# Patient Record
Sex: Male | Born: 1985 | Race: Black or African American | Hispanic: No | Marital: Single | Smoking: Never smoker
Health system: Southern US, Community
[De-identification: ages and names within clinical notes are randomized; demographics above are authoritative.]

## PROBLEM LIST (undated history)

## (undated) DIAGNOSIS — E119 Type 2 diabetes mellitus without complications: Secondary | ICD-10-CM

## (undated) DIAGNOSIS — I10 Essential (primary) hypertension: Secondary | ICD-10-CM

---
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
## 2005-08-01 ENCOUNTER — Ambulatory Visit: Payer: Self-pay | Admitting: Internal Medicine

## 2009-09-17 ENCOUNTER — Ambulatory Visit: Payer: Self-pay | Admitting: Family Medicine

## 2009-09-25 ENCOUNTER — Ambulatory Visit: Payer: Self-pay | Admitting: Family Medicine

## 2009-09-28 ENCOUNTER — Encounter: Admission: RE | Admit: 2009-09-28 | Discharge: 2009-09-28 | Payer: Self-pay | Admitting: Family Medicine

## 2010-03-26 ENCOUNTER — Ambulatory Visit: Payer: Self-pay | Admitting: Family Medicine

## 2010-05-02 ENCOUNTER — Ambulatory Visit (HOSPITAL_BASED_OUTPATIENT_CLINIC_OR_DEPARTMENT_OTHER): Admission: RE | Admit: 2010-05-02 | Discharge: 2010-05-02 | Payer: Self-pay | Admitting: Family Medicine

## 2010-05-03 ENCOUNTER — Ambulatory Visit: Payer: Self-pay | Admitting: Family Medicine

## 2010-05-04 ENCOUNTER — Ambulatory Visit: Payer: Self-pay | Admitting: Family Medicine

## 2010-05-08 ENCOUNTER — Ambulatory Visit: Payer: Self-pay | Admitting: Internal Medicine

## 2010-08-13 ENCOUNTER — Ambulatory Visit: Payer: Self-pay | Admitting: Family Medicine

## 2010-11-22 ENCOUNTER — Ambulatory Visit: Admit: 2010-11-22 | Payer: Self-pay | Admitting: Family Medicine

## 2011-12-27 ENCOUNTER — Emergency Department (HOSPITAL_BASED_OUTPATIENT_CLINIC_OR_DEPARTMENT_OTHER)
Admission: EM | Admit: 2011-12-27 | Discharge: 2011-12-27 | Disposition: A | Discharge status: Home / Self Care | Payer: Worker's Compensation | Attending: Emergency Medicine | Admitting: Emergency Medicine

## 2011-12-27 ENCOUNTER — Encounter (HOSPITAL_BASED_OUTPATIENT_CLINIC_OR_DEPARTMENT_OTHER): Payer: Self-pay

## 2011-12-27 ENCOUNTER — Emergency Department (INDEPENDENT_AMBULATORY_CARE_PROVIDER_SITE_OTHER): Payer: Worker's Compensation

## 2011-12-27 DIAGNOSIS — S93401A Sprain of unspecified ligament of right ankle, initial encounter: Secondary | ICD-10-CM

## 2011-12-27 DIAGNOSIS — M25579 Pain in unspecified ankle and joints of unspecified foot: Secondary | ICD-10-CM

## 2011-12-27 DIAGNOSIS — R609 Edema, unspecified: Secondary | ICD-10-CM | POA: Insufficient documentation

## 2011-12-27 DIAGNOSIS — S93409A Sprain of unspecified ligament of unspecified ankle, initial encounter: Secondary | ICD-10-CM | POA: Insufficient documentation

## 2011-12-27 DIAGNOSIS — Y9289 Other specified places as the place of occurrence of the external cause: Secondary | ICD-10-CM | POA: Insufficient documentation

## 2011-12-27 DIAGNOSIS — M899 Disorder of bone, unspecified: Secondary | ICD-10-CM

## 2011-12-27 DIAGNOSIS — X500XXA Overexertion from strenuous movement or load, initial encounter: Secondary | ICD-10-CM | POA: Insufficient documentation

## 2011-12-27 DIAGNOSIS — W19XXXA Unspecified fall, initial encounter: Secondary | ICD-10-CM

## 2011-12-27 MED ORDER — IBUPROFEN 800 MG PO TABS: 800.0000 mg | ORAL_TABLET | Freq: Three times a day (TID) | ORAL | Status: AC

## 2011-12-27 NOTE — ED Notes (Signed)
Patient transported to X-ray 

## 2011-12-27 NOTE — ED Notes (Signed)
Pt returned from radiology.

## 2011-12-27 NOTE — ED Notes (Signed)
Injury to right ankle that occurred while at work.  Pain increases with weight bearing.

## 2011-12-27 NOTE — ED Provider Notes (Signed)
History     CSN: [id number]  Arrival date & time 12/27/11  1045   First MD Initiated Contact with Patient 12/27/11 1251      Chief Complaint  Patient presents with  . Ankle Pain    (Consider location/radiation/quality/duration/timing/severity/associated sxs/prior treatment) Patient is a 26 y.o. male presenting with ankle pain. The history is provided by the patient. No language interpreter was used.  Ankle Pain  The incident occurred 1 to 2 hours ago. The incident occurred at work. The pain is present in the right ankle. The quality of the pain is described as aching. The pain is at a severity of 6/10. The pain is moderate. The pain has been constant since onset. Pertinent negatives include no inability to bear weight. He reports no foreign bodies present. The symptoms are aggravated by bearing weight. He has tried nothing for the symptoms. The treatment provided no relief.  Pt reports he turned his ankle at work.  Pt complains of swelling and pain    History reviewed. No pertinent past medical history.  History reviewed. No pertinent past surgical history.  No family history on file.  History  Substance Use Topics  . Smoking status: Never Smoker   . Smokeless tobacco: Never Used  . Alcohol Use: Yes     occasional      Review of Systems  Musculoskeletal: Positive for myalgias and joint swelling.  All other systems reviewed and are negative.    Allergies  Review of patient's allergies indicates no known allergies.  Home Medications  No current outpatient prescriptions on file.  BP 168/98  Pulse 77  Temp(Src) 98.2 F (36.8 C) (Oral)  Resp 20  Ht 6' (1.829 m)  Wt 320 lb (145.151 kg)  BMI 43.40 kg/m2  SpO2 100%  Physical Exam  Nursing note and vitals reviewed. Constitutional: He is oriented to person, place, and time. He appears well-developed and well-nourished.  HENT:  Head: Normocephalic.  Eyes: Pupils are equal, round, and reactive to light.    Musculoskeletal: He exhibits edema and tenderness.       Tender right ankle,  Decreased range of motion right ankle,  nv and ns intact  No instability  Neurological: He is alert and oriented to person, place, and time.  Skin: Skin is warm.  Psychiatric: He has a normal mood and affect.    ED Course  Procedures (including critical care time)  Labs Reviewed - No data to display Dg Ankle Complete Right  12/27/2011  *RADIOLOGY REPORT*  Clinical Data: 26 year old male status post fall, lateral ankle pain.  RIGHT ANKLE - COMPLETE 3+ VIEW  Comparison: Report of the tib-fib study from 12/10/1998  Findings: Healed distal right tibia shaft fracture with callus. Eccentric mixed lucent and sclerotic lesion of the distal right tibia metadiaphysis.  Mild cortical buckling but no periosteal reaction. This lesion was described in 2000.  There may be a small joint effusion.  Mortise joint alignment is preserved.  Talar dome is intact.  Calcaneus is intact.  No acute fracture or dislocation is identified.  Mild talar beaking is noted.  IMPRESSION: 1. No acute fracture or dislocation identified about the right ankle.  Ankle joint effusion suspected. 2.  Chronic benign distal tibial metadiaphysis lesion, reported in 2000. 3.  Remote distal third tibial shaft fracture.  Original Report Authenticated By: Harley Hallmark, M.D.     No diagnosis found.    MDM  Ibuprofen 800   aso crutches,  Follow up at occ med  for recheck in 3-4 days        Langston Masker, [place] 12/27/11 1341

## 2011-12-27 NOTE — ED Notes (Signed)
Karen Sofia, PA-C at bedside 

## 2011-12-27 NOTE — Discharge Instructions (Signed)
Ankle Sprain °An ankle sprain is an injury to the ligaments that hold the ankle joint together.  °CAUSES °The injury is usually caused by a fall or by twisting the ankle. It is important to tell your caregiver how the injury occurred and whether or not you were able to walk immediately after the injury.  °SYMPTOMS  °Pain is the primary symptom. It may be present at rest or only when you are trying to stand or walk. The ankle will likely be swollen. Bruising may develop immediately or after 1 or 2 days. It may be difficult or impossible to stand or walk. This depends on the severity of the sprain. °DIAGNOSIS  °Your caregiver can determine if a sprain has occurred based on the accident details and on examination of your ankle. Examination will include pressing and squeezing areas of the foot and ankle. Your caregiver will try to move the ankle in certain ways. X-rays may be used to be sure a bone was not broken, or that the ligament did not pull off of a bone (avulsion). There are standard guidelines that can reliably determine if an X-ray is needed. °TREATMENT  °Rest, ice, elevation, and compression are the basic modes of treatment. Certain types of braces can help stabilize the ankle and allow early return to walking. Your caregiver can make a recommendation for this. Medication may be recommended for pain. You may be referred to an orthopedist or a physical therapist for certain types of severe sprains. °HOME CARE INSTRUCTIONS  °· Apply ice to the sore area for 15 to 20 minutes, 3 to 4 times per day. Do this while you are awake for the first 2 days, or as directed. This can be stopped when the swelling goes away. Put the ice in a plastic bag and place a towel between the bag of ice and your skin.  °· Keep your leg elevated when possible to lessen swelling.  °· If your caregiver recommends crutches, use them as instructed with a non-weight bearing cast for 1 week. Then, you may walk on your ankle as the pain allows,  or as instructed. Gradually, put weight on the affected ankle. Continue to use crutches or a cane until you can walk without causing pain.  °· If a plaster splint was applied, wear the splint until you are seen for a follow-up examination. Rest it on nothing harder than a pillow the first 24 hours. Do not put weight on it. Do not get it wet. You may take it off to take a shower or bath.  °· You may have been given an elastic bandage to use with the plaster splint, or you may have been given a elastic bandage to use alone. The elastic bandage is too tight if you have numbness, tingling, or if your foot becomes cold and blue. Adjust the bandage to make it comfortable.  °· If an air splint was applied, you may blow more air into it or take some out to make it more comfortable. You may take it off at night and to take a shower or bath. Wiggle your toes in the splint several times per day if you are able.  °· Only take over-the-counter or prescription medicines for pain, discomfort, or fever as directed by your caregiver.  °· Do not drive a vehicle until your caregiver specifically tells you it is safe to do so.  °SEEK MEDICAL CARE IF:  °· You have an increase in bruising, swelling, or pain.  °· Your   toes feel cold.  °· Pain relief is not achieved with medications.  °SEEK IMMEDIATE MEDICAL CARE IF: °Your toes are numb or blue or you have severe pain. °MAKE SURE YOU:  °· Understand these instructions.  °· Will watch your condition.  °· Will get help right away if you are not doing well or get worse.  °Document Released: 10/31/2005 Document Revised: 02/04/2011 Document Reviewed: 06/04/2008 °[employer]® Patient Information ©2012 [employer]. °

## 2011-12-27 NOTE — ED Provider Notes (Signed)
Medical screening examination/treatment/procedure(s) were performed by non-physician practitioner and as supervising physician I was immediately available for consultation/collaboration.   Dione Booze, MD 12/27/11 [phone]

## 2019-11-18 ENCOUNTER — Ambulatory Visit: Payer: 59 | Attending: Internal Medicine

## 2021-03-30 ENCOUNTER — Encounter: Payer: Self-pay | Admitting: Emergency Medicine

## 2021-03-30 ENCOUNTER — Ambulatory Visit
Admission: EM | Admit: 2021-03-30 | Discharge: 2021-03-30 | Disposition: A | Discharge status: Home / Self Care | Payer: 59

## 2021-03-30 ENCOUNTER — Ambulatory Visit (INDEPENDENT_AMBULATORY_CARE_PROVIDER_SITE_OTHER): Payer: 59

## 2021-03-30 ENCOUNTER — Other Ambulatory Visit: Payer: Self-pay

## 2021-03-30 DIAGNOSIS — R059 Cough, unspecified: Secondary | ICD-10-CM

## 2021-03-30 HISTORY — DX: Type 2 diabetes mellitus without complications: E11.9

## 2021-03-30 HISTORY — DX: Essential (primary) hypertension: I10

## 2021-03-30 MED ORDER — PROMETHAZINE-DM 6.25-15 MG/5ML PO SYRP: 5.0000 mL | ORAL_SOLUTION | Freq: Four times a day (QID) | ORAL | 0 refills | Status: DC | PRN

## 2021-03-30 MED ORDER — BENZONATATE 100 MG PO CAPS: 200.0000 mg | ORAL_CAPSULE | Freq: Three times a day (TID) | ORAL | 0 refills | Status: DC

## 2021-03-30 MED ORDER — DOXYCYCLINE HYCLATE 100 MG PO CAPS: 100.0000 mg | ORAL_CAPSULE | Freq: Two times a day (BID) | ORAL | 0 refills | Status: DC

## 2021-03-30 NOTE — Discharge Instructions (Addendum)
Take the doxycycline twice daily with food for 10 days.  Use the Tessalon Perles every 8 hours during the day as needed for cough.  Take them with a small sip of water.  You may experience a numbness to the base of your tongue or metallic taste in her mouth, this is normal.  Use the Promethazine DM cough syrup at bedtime.  This cough syrup will make you drowsy.  Return for reevaluation for any new or worsening symptoms.

## 2021-03-30 NOTE — ED Triage Notes (Signed)
Pt c/o cough, fatigue, nasal congestion and sore throat. Started about 3 and half months ago. He states he has muscle soreness in his neck and chest from the coughing. Sore throat started about a month ago.

## 2021-03-30 NOTE — ED Provider Notes (Signed)
[facility]    CSN: [id number] Arrival date & time: 03/30/21  1631      History   Chief Complaint Chief Complaint  Patient presents with  . Cough    HPI Kyle Hernandez is a 35 y.o. male.   HPI   35 year old male here for evaluation of cough, fatigue, nasal congestion, and sore throat.  Patient reports that he has been experiencing symptoms for the last 3 and half months.  He reports that his cough is productive for a bloody rusty sputum.  He denies fever, shortness breath or wheezing, or runny nose.  Past Medical History:  Diagnosis Date  . Diabetes mellitus without complication (HCC)   . Hypertension     There are no problems to display for this patient.   History reviewed. No pertinent surgical history.     Home Medications    Prior to Admission medications   Medication Sig Start Date End Date Taking? Authorizing Provider  amLODipine (NORVASC) 5 MG tablet Take 1 tablet by mouth daily. 01/07/21  Yes [provider]  benzonatate (TESSALON) 100 MG capsule Take 2 capsules (200 mg total) by mouth every 8 (eight) hours. 03/30/21  Yes Becky Augusta, NP  doxycycline (VIBRAMYCIN) 100 MG capsule Take 1 capsule (100 mg total) by mouth 2 (two) times daily. 03/30/21  Yes Becky Augusta, NP  metFORMIN (GLUCOPHAGE) 500 MG tablet Take by mouth. 12/11/20 06/09/21 Yes [provider]  promethazine-dextromethorphan (PROMETHAZINE-DM) 6.25-15 MG/5ML syrup Take 5 mLs by mouth 4 (four) times daily as needed. 03/30/21  Yes Becky Augusta, NP    Family History Family History  Problem Relation Age of Onset  . Stroke Father     Social History Social History   Tobacco Use  . Smoking status: Never Smoker  . Smokeless tobacco: Never Used  Vaping Use  . Vaping Use: Never used  Substance Use Topics  . Alcohol use: Yes    Comment: occasional  . Drug use: No     Allergies   Patient has no known allergies.   Review of Systems Review of Systems   Constitutional: Negative for activity change, appetite change and fever.  HENT: Positive for congestion, rhinorrhea and sore throat.   Respiratory: Positive for cough. Negative for shortness of breath and wheezing.   Musculoskeletal: Negative for arthralgias and myalgias.  Skin: Negative for rash.  Hematological: Negative.   Psychiatric/Behavioral: Negative.      Physical Exam Triage Vital Signs ED Triage Vitals  Enc Vitals Group     BP 03/30/21 1747 (!) 147/103     Pulse Rate 03/30/21 1747 (!) 111     Resp 03/30/21 1747 20     Temp 03/30/21 1747 99.5 F (37.5 C)     Temp Source 03/30/21 1747 Oral     SpO2 03/30/21 1747 98 %     Weight 03/30/21 1745 (!) 320 lb 1.7 oz (145.2 kg)     Height 03/30/21 1745 6' (1.829 m)     Head Circumference --      Peak Flow --      Pain Score 03/30/21 1745 6     Pain Loc --      Pain Edu? --      Excl. in GC? --    No data found.  Updated Vital Signs BP (!) 147/103 (BP Location: Left Arm)   Pulse (!) 111   Temp 99.5 F (37.5 C) (Oral)   Resp 20   Ht 6' (1.829 m)  Wt (!) 320 lb 1.7 oz (145.2 kg)   SpO2 98%   BMI 43.41 kg/m   Visual Acuity Right Eye Distance:   Left Eye Distance:   Bilateral Distance:    Right Eye Near:   Left Eye Near:    Bilateral Near:     Physical Exam Vitals and nursing note reviewed.  Constitutional:      General: He is not in acute distress.    Appearance: Normal appearance. He is not ill-appearing.  HENT:     Head: Normocephalic and atraumatic.     Right Ear: Tympanic membrane, ear canal and external ear normal.     Left Ear: Tympanic membrane, ear canal and external ear normal.     Nose: Congestion and rhinorrhea present.     Mouth/Throat:     Mouth: Mucous membranes are moist.     Pharynx: Oropharynx is clear. No posterior oropharyngeal erythema.  Cardiovascular:     Rate and Rhythm: Normal rate and regular rhythm.     Pulses: Normal pulses.     Heart sounds: Normal heart sounds. No murmur  heard. No gallop.   Pulmonary:     Effort: Pulmonary effort is normal.     Breath sounds: No wheezing, rhonchi or rales.  Musculoskeletal:     Cervical back: Normal range of motion and neck supple.  Lymphadenopathy:     Cervical: No cervical adenopathy.  Skin:    General: Skin is warm and dry.     Capillary Refill: Capillary refill takes less than 2 seconds.     Findings: No erythema or rash.  Neurological:     General: No focal deficit present.     Mental Status: He is alert and oriented to person, place, and time.  Psychiatric:        Mood and Affect: Mood normal.        Behavior: Behavior normal.        Thought Content: Thought content normal.        Judgment: Judgment normal.      UC Treatments / Results  Labs (all labs ordered are listed, but only abnormal results are displayed) Labs Reviewed - No data to display  EKG   Radiology DG Chest 2 View  Result Date: 03/30/2021 CLINICAL DATA:  Cough for 3 months EXAM: CHEST - 2 VIEW COMPARISON:  None. FINDINGS: The heart size and mediastinal contours are within normal limits. Both lungs are clear. The visualized skeletal structures are unremarkable. IMPRESSION: No active cardiopulmonary disease. Electronically Signed   By: Jasmine Pang M.D.   On: 03/30/2021 18:36    Procedures Procedures (including critical care time)  Medications Ordered in UC Medications - No data to display  Initial Impression / Assessment and Plan / UC Course  I have reviewed the triage vital signs and the nursing notes.  Pertinent labs & imaging results that were available during my care of the patient were reviewed by me and considered in my medical decision making (see chart for details).   Patient is a very pleasant 35 year old male here for evaluation of respiratory complaints have been going for last 3/2 months and include a cough that is productive for a bloody sputum, fatigue, nasal congestion, and a sore throat.  Patient treated for sore  throat too his extensive coughing.  Patient denies fever, shortness of breath or wheezing, or runny nose.  Physical exam reveals pearly gray tympanic membranes bilaterally with normal light reflex and clear external auditory canals.  Nasal mucosa  has mild erythema and edema with clear nasal discharge.  Oropharyngeal exam reveals the presence of bloody secretions without overt edema or erythema of the tissues.  No cervical lymphadenopathy appreciated exam.  Cardiopulmonary exam reveals decreased lung sounds diffusely.  Will obtain chest x-ray to look for acute intrathoracic process.  Bloody sputum is concerning for tuberculosis as well as possible pneumonia.  Patient denies any recent foreign travel.  Radiology interpretation of chest x-ray is that is negative for acute intrathoracic process.  Due to patient's protracted length of symptoms, combined with his bloody sputum, we will treat him with antibiotics out of an abundance of precaution.  We will also give patient Tessalon Perles and Promethazine DM cough syrup for cough control.   Final Clinical Impressions(s) / UC Diagnoses   Final diagnoses:  Cough     Discharge Instructions     Take the doxycycline twice daily with food for 10 days.  Use the Tessalon Perles every 8 hours during the day as needed for cough.  Take them with a small sip of water.  You may experience a numbness to the base of your tongue or metallic taste in her mouth, this is normal.  Use the Promethazine DM cough syrup at bedtime.  This cough syrup will make you drowsy.  Return for reevaluation for any new or worsening symptoms.    ED Prescriptions    Medication Sig Dispense Auth. Provider   doxycycline (VIBRAMYCIN) 100 MG capsule Take 1 capsule (100 mg total) by mouth 2 (two) times daily. 20 capsule Becky Augusta, NP   benzonatate (TESSALON) 100 MG capsule Take 2 capsules (200 mg total) by mouth every 8 (eight) hours. 21 capsule Becky Augusta, NP    promethazine-dextromethorphan (PROMETHAZINE-DM) 6.25-15 MG/5ML syrup Take 5 mLs by mouth 4 (four) times daily as needed. 118 mL Becky Augusta, NP     PDMP not reviewed this encounter.   Becky Augusta, NP 03/30/21 [phone]

## 2022-07-29 IMAGING — CR DG CHEST 2V
2 series · 2 of 2 positions shown · non-contrast
Comparison: None.

CLINICAL DATA: Cough for 3 months

EXAM:
CHEST - 2 VIEW

[chest pa]
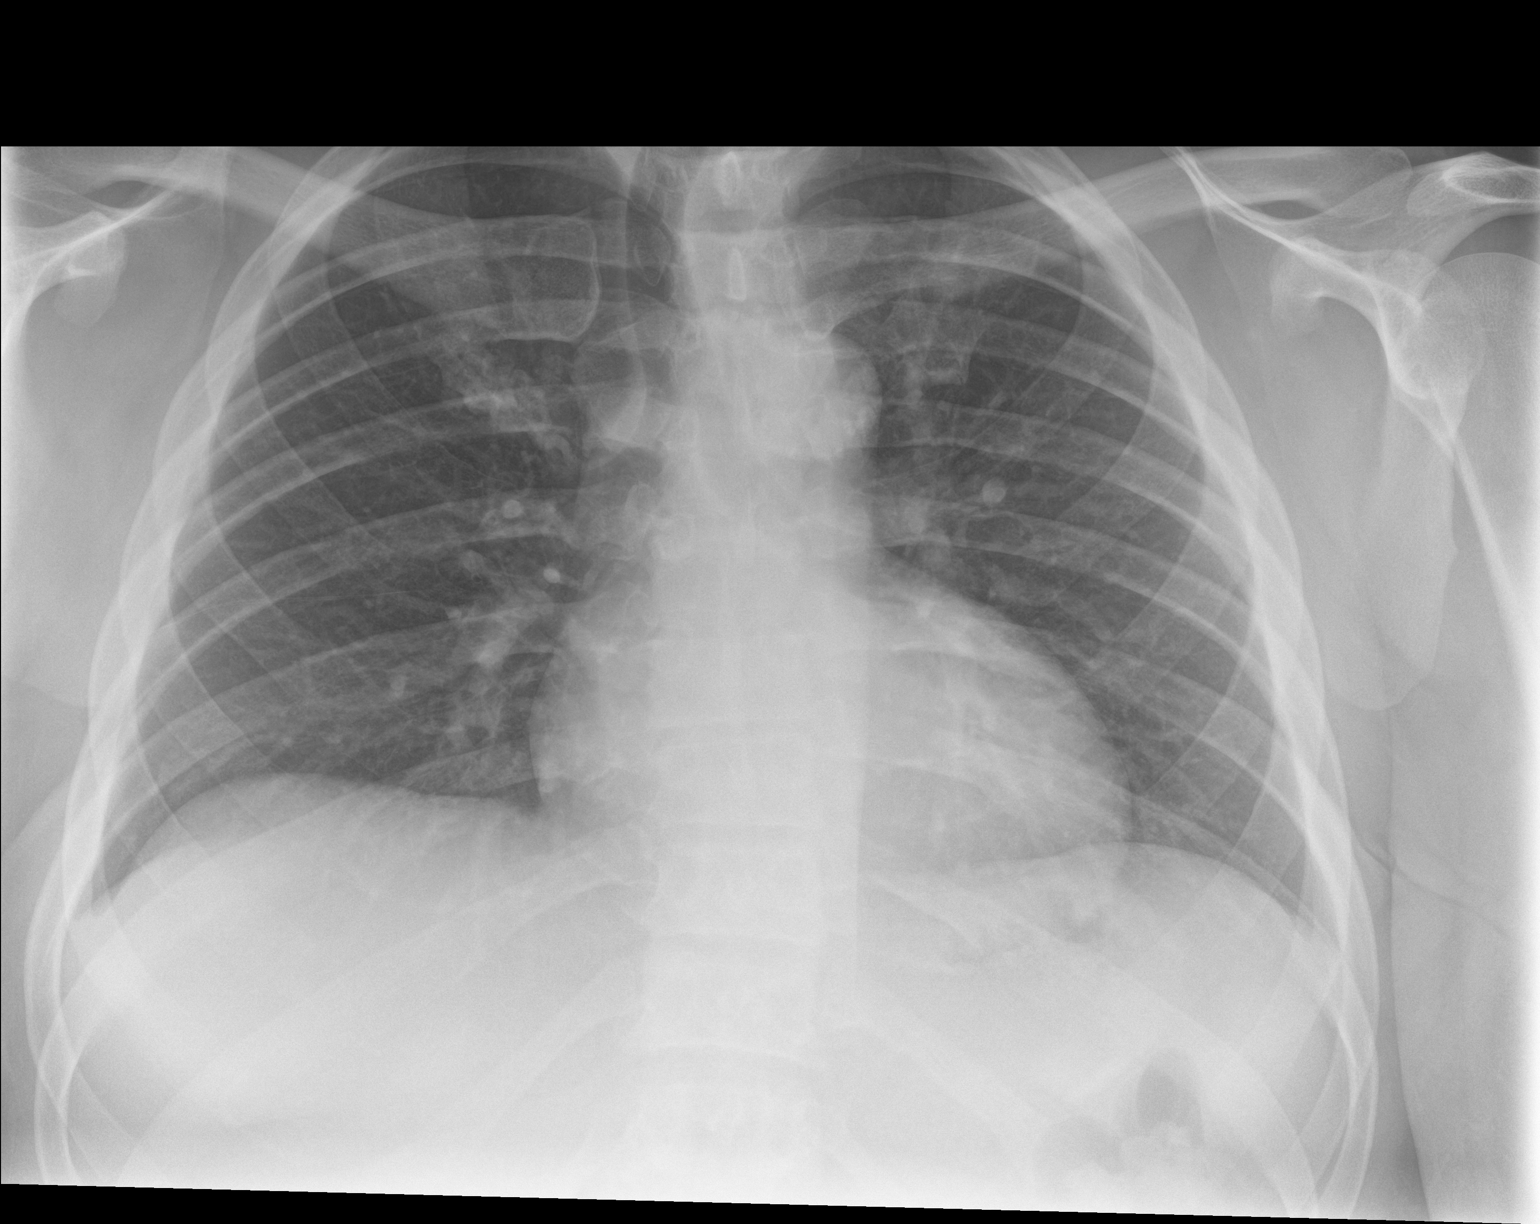

[chest lat]
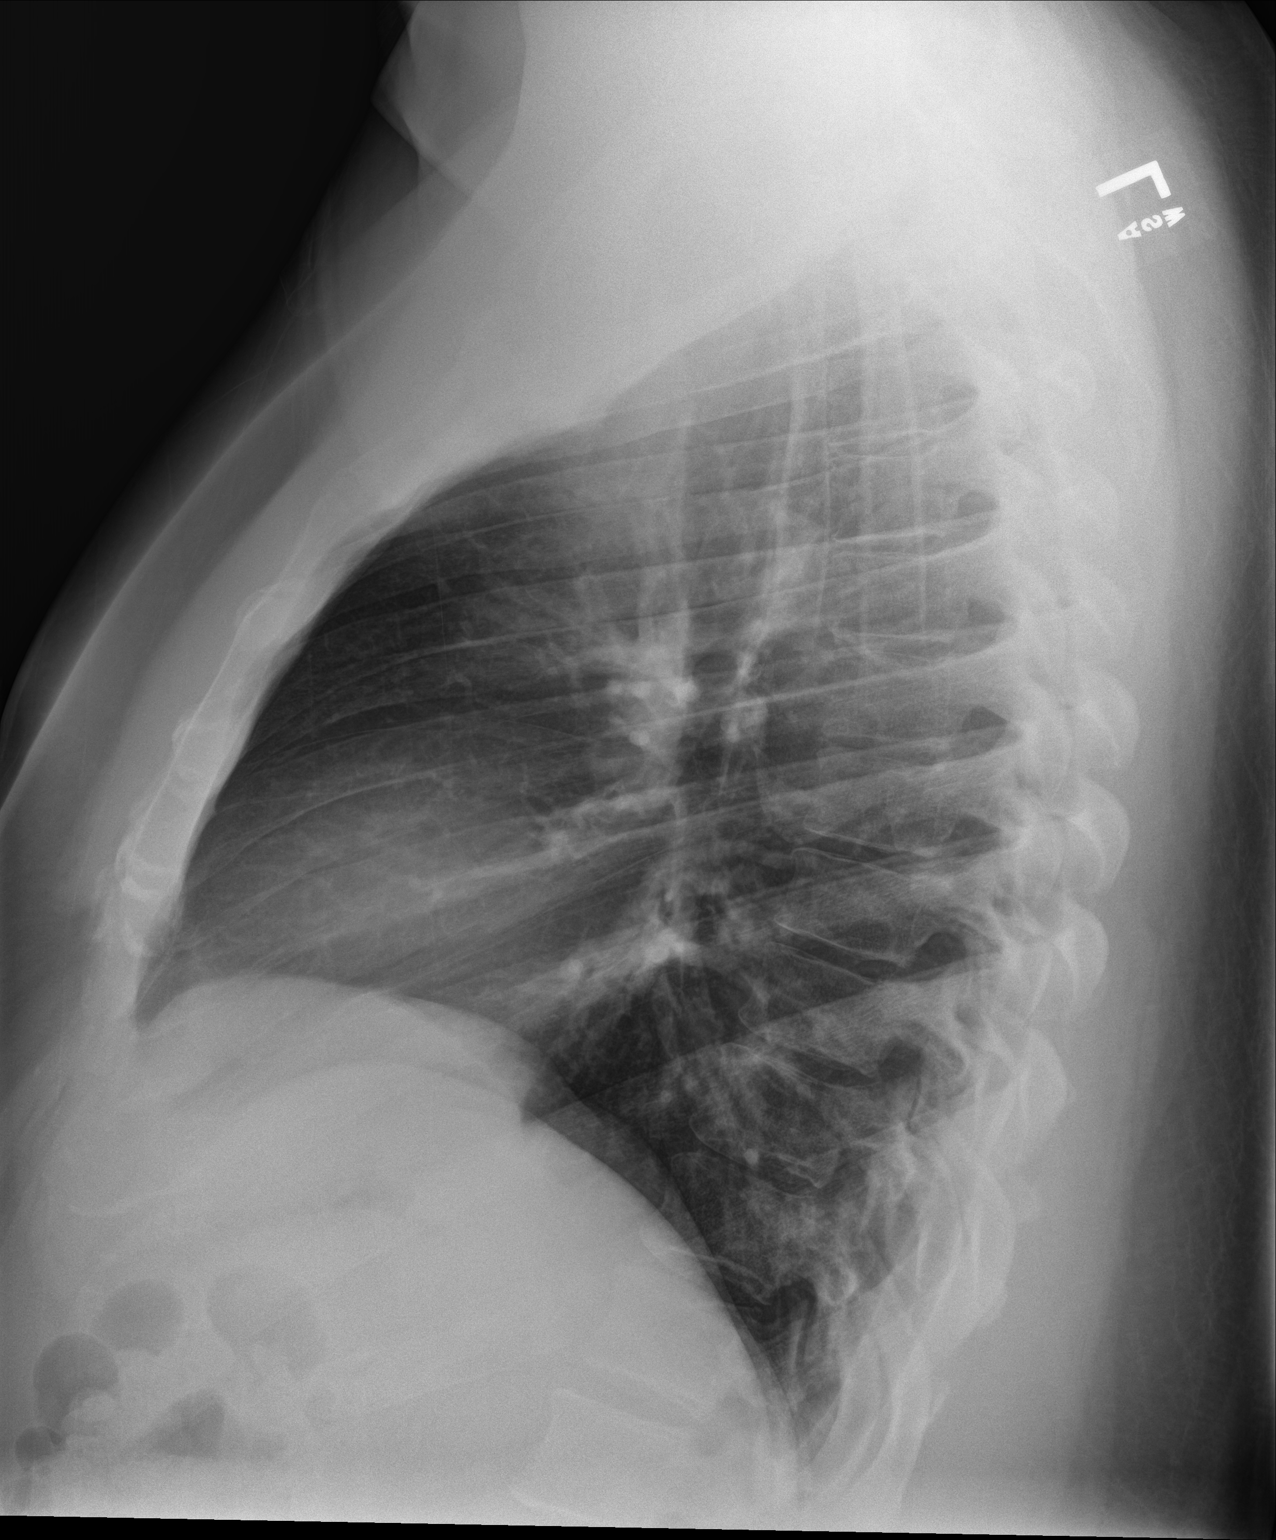

[2 of 2 positions shown; findings below may reference images not displayed]

FINDINGS: The heart size and mediastinal contours are within normal limits.
Both lungs are clear. The visualized skeletal structures are
unremarkable.
IMPRESSION: No active cardiopulmonary disease.

## 2022-11-25 ENCOUNTER — Ambulatory Visit
Admission: EM | Admit: 2022-11-25 | Discharge: 2022-11-25 | Disposition: A | Discharge status: Home / Self Care | Payer: 59 | Attending: Emergency Medicine | Admitting: Emergency Medicine

## 2022-11-25 ENCOUNTER — Encounter: Payer: Self-pay | Admitting: Emergency Medicine

## 2022-11-25 DIAGNOSIS — J0140 Acute pansinusitis, unspecified: Secondary | ICD-10-CM

## 2022-11-25 MED ORDER — AMOXICILLIN-POT CLAVULANATE 875-125 MG PO TABS: 1.0000 | ORAL_TABLET | Freq: Two times a day (BID) | ORAL | 0 refills | Status: AC

## 2022-11-25 MED ORDER — IPRATROPIUM BROMIDE 0.06 % NA SOLN: 2.0000 | Freq: Four times a day (QID) | NASAL | 12 refills | Status: AC

## 2022-11-25 NOTE — ED Triage Notes (Signed)
Patient c/o sinus congestion and nasal pressure that started 2 weeks ago.  Patient denies fevers.

## 2022-11-25 NOTE — Discharge Instructions (Signed)
The Augmentin twice daily with food for 10 days for treatment of your sinusitis.  Perform sinus irrigation 2-3 times a day with a [employer] sinus rinse kit and distilled water.  Do not use tap water.  Use the Atrovent nasal spray, 2 squirts in each nostril every 6 hours, as needed for nasal congestion.  You can use plain over-the-counter Mucinex every 6 hours to break up the stickiness of the mucus so your body can clear it.  Increase your oral fluid intake to thin out your mucus so that is also able for your body to clear more easily.  Take an over-the-counter probiotic, such as Culturelle-align-activia, 1 hour after each dose of antibiotic to prevent diarrhea.  If you develop any new or worsening symptoms return for reevaluation or see your primary care provider.

## 2022-11-25 NOTE — ED Provider Notes (Signed)
[facility]    CSN: [id number] Arrival date & time: 11/25/22  1315      History   Chief Complaint Chief Complaint  Patient presents with   Sinus Problem    HPI Kyle Hernandez is a 37 y.o. male.   HPI  37 year old male here for evaluation of sinus pressure.  The patient reports that he has been experiencing frontal and maxillary sinus pressure for the last 2 weeks.  He has been getting thick green-yellow discharge from his nose but denies any fever or ear pain.  He has had a mild intermittent cough and denies any shortness of breath or wheezing.  He has been doing sinus irrigation which has helped relieve some of his mucus burden.  Past Medical History:  Diagnosis Date   Diabetes mellitus without complication ([place])    Hypertension     There are no problems to display for this patient.   History reviewed. No pertinent surgical history.     Home Medications    Prior to Admission medications   Medication Sig Start Date End Date Taking? Authorizing Provider  amLODipine (NORVASC) 5 MG tablet Take 1 tablet by mouth daily. 01/07/21  Yes [provider]  amoxicillin-clavulanate (AUGMENTIN) 875-125 MG tablet Take 1 tablet by mouth every 12 (twelve) hours for 10 days. 11/25/22 12/05/22 Yes Margarette Canada, NP  ipratropium (ATROVENT) 0.06 % nasal spray Place 2 sprays into both nostrils 4 (four) times daily. 11/25/22  Yes Margarette Canada, NP  metFORMIN (GLUCOPHAGE) 500 MG tablet Take by mouth. 12/11/20 11/25/22 Yes [provider]    Family History Family History  Problem Relation Age of Onset   Stroke Father     Social History Social History   Tobacco Use   Smoking status: Never   Smokeless tobacco: Never  Vaping Use   Vaping Use: Never used  Substance Use Topics   Alcohol use: Yes    Comment: occasional   Drug use: No     Allergies   Patient has no known allergies.   Review of Systems Review of Systems  Constitutional:  Negative for  fever.  HENT:  Positive for congestion, rhinorrhea, sinus pressure and sinus pain. Negative for ear pain.   Respiratory:  Positive for cough. Negative for shortness of breath and wheezing.      Physical Exam Triage Vital Signs ED Triage Vitals  Enc Vitals Group     BP 11/25/22 1427 (!) 172/104     Pulse Rate 11/25/22 1427 100     Resp 11/25/22 1427 15     Temp 11/25/22 1427 98.6 F (37 C)     Temp Source 11/25/22 1427 Oral     SpO2 11/25/22 1427 95 %     Weight 11/25/22 1424 300 lb (136.1 kg)     Height 11/25/22 1424 6' (1.829 m)     Head Circumference --      Peak Flow --      Pain Score 11/25/22 1424 0     Pain Loc --      Pain Edu? --      Excl. in [place]? --    No data found.  Updated Vital Signs BP (!) 172/104 (BP Location: Right Arm) Comment: Patient has not been taking his BP medicine  Pulse 100   Temp 98.6 F (37 C) (Oral)   Resp 15   Ht 6' (1.829 m)   Wt 300 lb (136.1 kg)   SpO2 95%   BMI 40.69 kg/m  Visual Acuity Right Eye Distance:   Left Eye Distance:   Bilateral Distance:    Right Eye Near:   Left Eye Near:    Bilateral Near:     Physical Exam Vitals and nursing note reviewed.  Constitutional:      Appearance: Normal appearance. He is not ill-appearing.  HENT:     Head: Normocephalic and atraumatic.     Right Ear: Tympanic membrane, ear canal and external ear normal. There is no impacted cerumen.     Left Ear: Tympanic membrane, ear canal and external ear normal. There is no impacted cerumen.     Nose: Congestion and rhinorrhea present.     Comments: Patient is marked erythema and edema bilateral nasal passages.  There is purulent discharge in both nares.  He has tenderness to compression of bilateral frontal and maxillary sinuses on exam.    Mouth/Throat:     Mouth: Mucous membranes are moist.     Pharynx: Oropharynx is clear. No posterior oropharyngeal erythema.  Cardiovascular:     Rate and Rhythm: Normal rate and regular rhythm.      Pulses: Normal pulses.     Heart sounds: Normal heart sounds. No murmur heard.    No friction rub. No gallop.  Pulmonary:     Effort: Pulmonary effort is normal.     Breath sounds: Normal breath sounds. No wheezing, rhonchi or rales.  Musculoskeletal:     Cervical back: Normal range of motion and neck supple.  Lymphadenopathy:     Cervical: No cervical adenopathy.  Skin:    General: Skin is warm and dry.     Capillary Refill: Capillary refill takes less than 2 seconds.  Neurological:     General: No focal deficit present.     Mental Status: He is alert and oriented to person, place, and time.  Psychiatric:        Mood and Affect: Mood normal.        Behavior: Behavior normal.        Thought Content: Thought content normal.        Judgment: Judgment normal.      UC Treatments / Results  Labs (all labs ordered are listed, but only abnormal results are displayed) Labs Reviewed - No data to display  EKG   Radiology No results found.  Procedures Procedures (including critical care time)  Medications Ordered in UC Medications - No data to display  Initial Impression / Assessment and Plan / UC Course  I have reviewed the triage vital signs and the nursing notes.  Pertinent labs & imaging results that were available during my care of the patient were reviewed by me and considered in my medical decision making (see chart for details).   Patient is a nontoxic-appearing 37 year old male here for evaluation of 2 weeks worth of sinus pressure with purulent discharge as outlined HPI above.  He does have inflammation of his nasal passages and there is purulent discharge in both nares on exam.  He also has tenderness to compression of bilateral frontal and maxillary sinuses.  Cardiopulmonary exam bisque lung sounds in all fields.  Given the fact that he has had the symptoms for the past 2 weeks I do feel that a trial of antibiotics is warranted.  I will start him on Augmentin 875 twice  daily for 10 days for treatment of pansinusitis.  I have encouraged him to continue using his nasal irrigation to help alleviate the mucus burden.  I will also prescribe  Atrovent nasal spray that he can use 4 times a day to help with his congestion.  Return precautions reviewed.   Final Clinical Impressions(s) / UC Diagnoses   Final diagnoses:  Acute non-recurrent pansinusitis     Discharge Instructions      The Augmentin twice daily with food for 10 days for treatment of your sinusitis.  Perform sinus irrigation 2-3 times a day with a [employer] sinus rinse kit and distilled water.  Do not use tap water.  Use the Atrovent nasal spray, 2 squirts in each nostril every 6 hours, as needed for nasal congestion.  You can use plain over-the-counter Mucinex every 6 hours to break up the stickiness of the mucus so your body can clear it.  Increase your oral fluid intake to thin out your mucus so that is also able for your body to clear more easily.  Take an over-the-counter probiotic, such as Culturelle-align-activia, 1 hour after each dose of antibiotic to prevent diarrhea.  If you develop any new or worsening symptoms return for reevaluation or see your primary care provider.      ED Prescriptions     Medication Sig Dispense Auth. Provider   amoxicillin-clavulanate (AUGMENTIN) 875-125 MG tablet Take 1 tablet by mouth every 12 (twelve) hours for 10 days. 20 tablet Margarette Canada, NP   ipratropium (ATROVENT) 0.06 % nasal spray Place 2 sprays into both nostrils 4 (four) times daily. 15 mL Margarette Canada, NP      PDMP not reviewed this encounter.   Margarette Canada, NP 11/25/22 1517
